# Patient Record
Sex: Female | Born: 1970 | Race: White | Hispanic: No | Marital: Single | Smoking: Former smoker
Health system: Southern US, Community
[De-identification: ages and names within clinical notes are randomized; demographics above are authoritative.]

## PROBLEM LIST (undated history)

## (undated) HISTORY — PX: TUBAL LIGATION: SHX77

## (undated) HISTORY — PX: ABDOMINAL HYSTERECTOMY: SHX81

---

## 2012-07-09 ENCOUNTER — Emergency Department (INDEPENDENT_AMBULATORY_CARE_PROVIDER_SITE_OTHER)
Admission: EM | Admit: 2012-07-09 | Discharge: 2012-07-09 | Disposition: A | Discharge status: Home / Self Care | Payer: No Typology Code available for payment source | Source: Home / Self Care | Attending: Family Medicine | Admitting: Family Medicine

## 2012-07-09 ENCOUNTER — Encounter: Payer: Self-pay | Admitting: *Deleted

## 2012-07-09 DIAGNOSIS — Z23 Encounter for immunization: Secondary | ICD-10-CM

## 2012-07-09 MED ORDER — INFLUENZA VAC TYP A&B SURF ANT IM INJ
0.5000 mL | INJECTION | Freq: Once | INTRAMUSCULAR | Status: AC
  Administered 2012-07-09: 0.5 mL via INTRAMUSCULAR

## 2012-07-09 NOTE — ED Notes (Signed)
The pt is here today for a influenza vaccine.

## 2013-05-28 ENCOUNTER — Emergency Department (INDEPENDENT_AMBULATORY_CARE_PROVIDER_SITE_OTHER)
Admission: EM | Admit: 2013-05-28 | Discharge: 2013-05-28 | Disposition: A | Discharge status: Home / Self Care | Payer: No Typology Code available for payment source | Source: Home / Self Care

## 2013-05-28 ENCOUNTER — Encounter: Payer: Self-pay | Admitting: Emergency Medicine

## 2013-05-28 DIAGNOSIS — Z23 Encounter for immunization: Secondary | ICD-10-CM

## 2013-05-28 DIAGNOSIS — Z0000 Encounter for general adult medical examination without abnormal findings: Secondary | ICD-10-CM

## 2013-05-28 MED ORDER — INFLUENZA VAC SPLIT QUAD 0.5 ML IM SUSP
0.5000 mL | Freq: Once | INTRAMUSCULAR | Status: AC
  Administered 2013-05-28: 0.5 mL via INTRAMUSCULAR

## 2013-05-28 NOTE — ED Notes (Signed)
Flu Shot

## 2014-01-16 ENCOUNTER — Emergency Department (INDEPENDENT_AMBULATORY_CARE_PROVIDER_SITE_OTHER): Payer: No Typology Code available for payment source

## 2014-01-16 ENCOUNTER — Encounter: Payer: Self-pay | Admitting: Emergency Medicine

## 2014-01-16 ENCOUNTER — Emergency Department (INDEPENDENT_AMBULATORY_CARE_PROVIDER_SITE_OTHER)
Admission: EM | Admit: 2014-01-16 | Discharge: 2014-01-16 | Disposition: A | Discharge status: Home / Self Care | Payer: No Typology Code available for payment source | Source: Home / Self Care | Attending: Family Medicine | Admitting: Family Medicine

## 2014-01-16 DIAGNOSIS — S92309A Fracture of unspecified metatarsal bone(s), unspecified foot, initial encounter for closed fracture: Secondary | ICD-10-CM

## 2014-01-16 DIAGNOSIS — S92352A Displaced fracture of fifth metatarsal bone, left foot, initial encounter for closed fracture: Secondary | ICD-10-CM

## 2014-01-16 DIAGNOSIS — M7730 Calcaneal spur, unspecified foot: Secondary | ICD-10-CM

## 2014-01-16 DIAGNOSIS — M25473 Effusion, unspecified ankle: Secondary | ICD-10-CM

## 2014-01-16 DIAGNOSIS — M25476 Effusion, unspecified foot: Secondary | ICD-10-CM

## 2014-01-16 DIAGNOSIS — IMO0002 Reserved for concepts with insufficient information to code with codable children: Secondary | ICD-10-CM

## 2014-01-16 MED ORDER — KETOROLAC TROMETHAMINE 60 MG/2ML IM SOLN
60.0000 mg | Freq: Once | INTRAMUSCULAR | Status: AC
  Administered 2014-01-16: 60 mg via INTRAMUSCULAR

## 2014-01-16 MED ORDER — KETOROLAC TROMETHAMINE 60 MG/2ML IM SOLN: 60.0000 mg | Freq: Once | INTRAMUSCULAR | Status: DC

## 2014-01-16 NOTE — ED Notes (Signed)
Pt states she missed a step and fell down landing on her R knee (bruise only) and L foot and ankle bruised swollen and painful.

## 2014-01-16 NOTE — Discharge Instructions (Signed)
Wear cast boot.  Use crutches.  Elevate leg whenever possible.  May take Ibuprofen 200mg , 4 tabs every 8 hours with food.

## 2014-01-16 NOTE — ED Provider Notes (Signed)
CSN: [id number]     Arrival date & time 01/16/14  1109 History   First MD Initiated Contact with Patient 01/16/14 1202     Chief Complaint  Patient presents with  . Foot Injury  . Ankle Pain      HPI Comments: Patient was walking down stairs 10:30pm yesterday and missed a step.  She twisted her left foot/ankle, resulting in persistent pain/swelling in her lateral foot.  Patient is a 43 y.o. female presenting with foot injury. The history is provided by the patient and the spouse.  Foot Injury Location:  Foot Time since incident:  13 hours Injury: yes   Mechanism of injury comment:  Twisted foot/ankle on stairs Foot location:  L foot Pain details:    Quality:  Aching   Radiates to:  Does not radiate   Severity:  Moderate   Onset quality:  Sudden   Duration:  13 hours   Timing:  Constant   Progression:  Unchanged Chronicity:  New Dislocation: no   Prior injury to area:  No Relieved by:  Nothing Worsened by:  Bearing weight Ineffective treatments:  Ice Associated symptoms: decreased ROM, stiffness and swelling   Associated symptoms: no back pain, no muscle weakness, no numbness and no tingling     History reviewed. No pertinent past medical history. Past Surgical History  Procedure Laterality Date  . Tubal ligation    . Abdominal hysterectomy     History reviewed. No pertinent family history. History  Substance Use Topics  . Smoking status: Former Games developermoker  . Smokeless tobacco: Never Used  . Alcohol Use: No   OB History   Grav Para Term Preterm Abortions TAB SAB Ect Mult Living                 Review of Systems  Musculoskeletal: Positive for stiffness. Negative for back pain.    Allergies  Codeine  Home Medications   Prior to Admission medications   Medication Sig Start Date End Date Taking? Authorizing Provider  FLUoxetine (PROZAC) 10 MG capsule Take 10 mg by mouth daily.   Yes Historical Provider, MD  traZODone (DESYREL) 100 MG tablet Take 100 mg by mouth  at bedtime.    Historical Provider, MD   BP 114/73  Pulse 79  Temp(Src) 98.5 F (36.9 C)  Ht 5\' 8"  (1.727 m)  Wt 167 lb 8 oz (75.978 kg)  BMI 25.47 kg/m2  SpO2 98% Physical Exam  Nursing note and vitals reviewed. Constitutional: She is oriented to person, place, and time. She appears well-developed and well-nourished. No distress.  HENT:  Head: Atraumatic.  Eyes: Conjunctivae are normal. Pupils are equal, round, and reactive to light.  Musculoskeletal:       Left foot: She exhibits decreased range of motion, tenderness, bony tenderness and swelling. She exhibits normal capillary refill, no crepitus, no deformity and no laceration.       Feet:  Left foot has tenderness, swelling, and ecchymosis laterally over the fifth metatarsal as noted on diagram. Decreased range of motion fifth toe.  Distal neurovascular function is intact.    Neurological: She is alert and oriented to person, place, and time.  Skin: Skin is warm and dry.    ED Course  Procedures  none     Imaging Review Dg Ankle Complete Left  01/16/2014   CLINICAL DATA:  Foot pain, laterally, with swelling into the ankle  EXAM: LEFT ANKLE COMPLETE - 3+ VIEW  COMPARISON:  None.  FINDINGS: Mild lateral  soft tissue swelling. No fracture or dislocation. Mortise is intact. Small joint effusion. Tiny heel spur. Known fracture fifth metatarsal.  IMPRESSION: Mild soft tissue swelling in the ankle. Known fifth metatarsal fracture.   Electronically Signed   By: Esperanza Heiraymond  Rubner M.D.   On: 01/16/2014 12:06   Dg Foot Complete Left  01/16/2014   CLINICAL DATA:  Pain lateral foot  EXAM: LEFT FOOT - COMPLETE 3+ VIEW  COMPARISON:  None.  FINDINGS: Comminuted fracture distal shaft of the fifth metatarsal. Distal fracture fragment is displaced an entire shafts width medially. Soft tissue swelling identified over the fifth metatarsal.  IMPRESSION: Fracture fifth metatarsal   Electronically Signed   By: Esperanza Heiraymond  Rubner M.D.   On: 01/16/2014 12:05      MDM   1. Displaced fracture of fifth metatarsal bone, left, closed, initial encounter    Toradol 60mg  IM.   Discussed with Dr. Rodney Langtonhomas Nieves Chapa who will see her in clinic tomorrow morning. Applied cam walker.  Dispensed crutches.  Wear cast boot.  Use crutches.  Elevate leg whenever possible.  May take Ibuprofen 200mg , 4 tabs every 8 hours with food.     Lattie HawStephen A Beese, MD 01/16/14 1315

## 2014-01-17 ENCOUNTER — Ambulatory Visit (INDEPENDENT_AMBULATORY_CARE_PROVIDER_SITE_OTHER): Payer: No Typology Code available for payment source | Admitting: Sports Medicine

## 2014-01-17 ENCOUNTER — Ambulatory Visit (INDEPENDENT_AMBULATORY_CARE_PROVIDER_SITE_OTHER): Payer: No Typology Code available for payment source

## 2014-01-17 DIAGNOSIS — S92352A Displaced fracture of fifth metatarsal bone, left foot, initial encounter for closed fracture: Secondary | ICD-10-CM

## 2014-01-17 DIAGNOSIS — S92309A Fracture of unspecified metatarsal bone(s), unspecified foot, initial encounter for closed fracture: Secondary | ICD-10-CM

## 2014-01-17 DIAGNOSIS — S92353A Displaced fracture of fifth metatarsal bone, unspecified foot, initial encounter for closed fracture: Secondary | ICD-10-CM | POA: Insufficient documentation

## 2014-01-17 DIAGNOSIS — IMO0002 Reserved for concepts with insufficient information to code with codable children: Secondary | ICD-10-CM

## 2014-01-17 MED ORDER — AMBULATORY NON FORMULARY MEDICATION: Status: AC

## 2014-01-17 MED ORDER — HYDROCODONE-ACETAMINOPHEN 5-325 MG PO TABS: 1.0000 | ORAL_TABLET | Freq: Three times a day (TID) | ORAL | Status: DC | PRN

## 2014-01-17 NOTE — Assessment & Plan Note (Addendum)
Reduction under hematoma block. Return in one week for repeat imaging.  Hydrocodone for pain, rolling knee scooter.

## 2014-01-17 NOTE — Progress Notes (Signed)
   Subjective:    I'm seeing this patient as a consultation for:  Dr. Cathren HarshBeese  CC: Left foot fracture  HPI: This is a pleasant 43 year old female, yesterday she misstep, and hyper plantar flexed her foot. She had immediate pain, swelling, bruising. X-ray showed a displaced, angulated, overlapping fracture of the fifth metatarsal neck. She is in a boot and nonweightbearing with crutches. Pain is severe, persistent.  Past medical history, Surgical history, Family history not pertinant except as noted below, Social history, Allergies, and medications have been entered into the medical record, reviewed, and no changes needed.   Review of Systems: No headache, visual changes, nausea, vomiting, diarrhea, constipation, dizziness, abdominal pain, skin rash, fevers, chills, night sweats, weight loss, swollen lymph nodes, body aches, joint swelling, muscle aches, chest pain, shortness of breath, mood changes, visual or auditory hallucinations.   Objective:   General: Well Developed, well nourished, and in no acute distress.  Neuro/Psych: Alert and oriented x3, extra-ocular muscles intact, able to move all 4 extremities, sensation grossly intact. Skin: Warm and dry, no rashes noted.  Respiratory: Not using accessory muscles, speaking in full sentences, trachea midline.  Cardiovascular: Pulses palpable, no extremity edema. Abdomen: Does not appear distended. Left foot: Swollen, bruised, tender palpation of the fifth metatarsal neck. Neurovascularly intact distally.  Procedure:  Fracture reduction. Risks, benefits, and alternatives explained and consent obtained. Time out conducted. Surface cleaned with alcohol. 5cc lidocaine infiltrated around fracture in a hematoma block. Adequate anesthesia ensured. Fracture reduction procedure: Applied axial traction to the neck of the fifth metatarsal, until a palpable pop was felt indicated reduction of the fracture. A bulky Kloosterman splint was then placed. Pt  stable. Postreduction films reviewed and noted improved/near anatomic alignment. Aftercare and follow-up advised.  Impression and Recommendations:   This case required medical decision making of moderate complexity.

## 2014-01-18 ENCOUNTER — Ambulatory Visit (INDEPENDENT_AMBULATORY_CARE_PROVIDER_SITE_OTHER): Payer: No Typology Code available for payment source | Admitting: Sports Medicine

## 2014-01-18 ENCOUNTER — Encounter: Payer: Self-pay | Admitting: Sports Medicine

## 2014-01-18 ENCOUNTER — Telehealth: Payer: Self-pay | Admitting: *Deleted

## 2014-01-18 VITALS — BP 145/86 | HR 90 | Ht 68.0 in | Wt 168.0 lb

## 2014-01-18 DIAGNOSIS — S92309A Fracture of unspecified metatarsal bone(s), unspecified foot, initial encounter for closed fracture: Secondary | ICD-10-CM

## 2014-01-18 DIAGNOSIS — S92352A Displaced fracture of fifth metatarsal bone, left foot, initial encounter for closed fracture: Secondary | ICD-10-CM

## 2014-01-18 MED ORDER — KETOROLAC TROMETHAMINE 60 MG/2ML IM SOLN
60.0000 mg | Freq: Once | INTRAMUSCULAR | Status: AC
  Administered 2014-01-18: 60 mg via INTRAMUSCULAR

## 2014-01-18 MED ORDER — HYDROMORPHONE HCL 8 MG PO TABS: ORAL_TABLET | ORAL | Status: AC

## 2014-01-18 MED ORDER — OXYCODONE-ACETAMINOPHEN 5-325 MG PO TABS: 1.0000 | ORAL_TABLET | Freq: Three times a day (TID) | ORAL | Status: DC | PRN

## 2014-01-18 MED ORDER — HYDROMORPHONE HCL 2 MG PO TABS: ORAL_TABLET | ORAL | Status: DC

## 2014-01-18 NOTE — Telephone Encounter (Signed)
Pt called and is in a lot of pain her Norco is not enough to control the pain.  Didn't sleep last night a lot of throbbing in her foot.  KG CMA

## 2014-01-18 NOTE — Assessment & Plan Note (Signed)
Increasing pain one day post reduction. Repeat x-rays. Remade a new posterior slab splint. Continue nonweightbearing.

## 2014-01-18 NOTE — Telephone Encounter (Signed)
Switching to oxycodone

## 2014-01-18 NOTE — Progress Notes (Signed)
  Subjective: Anatomic reduction of displaced fifth metatarsal fracture yesterday, having increased pain.   Objective: General: Well-developed, well-nourished, and in no acute distress. Left foot: Bulky Gores splint is removed, she does have bruising, mild swelling, neurovascularly intact distally, no skin breakdown.  Posterior slab splint placed  Assessment/plan:

## 2014-01-18 NOTE — Addendum Note (Signed)
Addended by: Justus MemoryGRUBICH, KIMBERLEE S on: 01/18/2014 04:52 PM   Modules accepted: Orders

## 2014-01-19 ENCOUNTER — Ambulatory Visit (INDEPENDENT_AMBULATORY_CARE_PROVIDER_SITE_OTHER): Payer: No Typology Code available for payment source

## 2014-01-19 DIAGNOSIS — S92352A Displaced fracture of fifth metatarsal bone, left foot, initial encounter for closed fracture: Secondary | ICD-10-CM

## 2014-01-19 DIAGNOSIS — IMO0001 Reserved for inherently not codable concepts without codable children: Secondary | ICD-10-CM

## 2014-01-20 NOTE — Assessment & Plan Note (Signed)
Unfortunately the fracture has lost its reduction. At [facility] point [facility]

## 2014-01-21 ENCOUNTER — Ambulatory Visit: Payer: No Typology Code available for payment source | Admitting: Sports Medicine

## 2015-02-19 IMAGING — CR DG FOOT COMPLETE 3+V*L*
3 series · 3 of 3 positions shown · non-contrast
Comparison: Left foot series [DATE].

CLINICAL DATA: Fracture.

EXAM:
LEFT FOOT - COMPLETE 3+ VIEW

[view not recorded (1 of 3)]
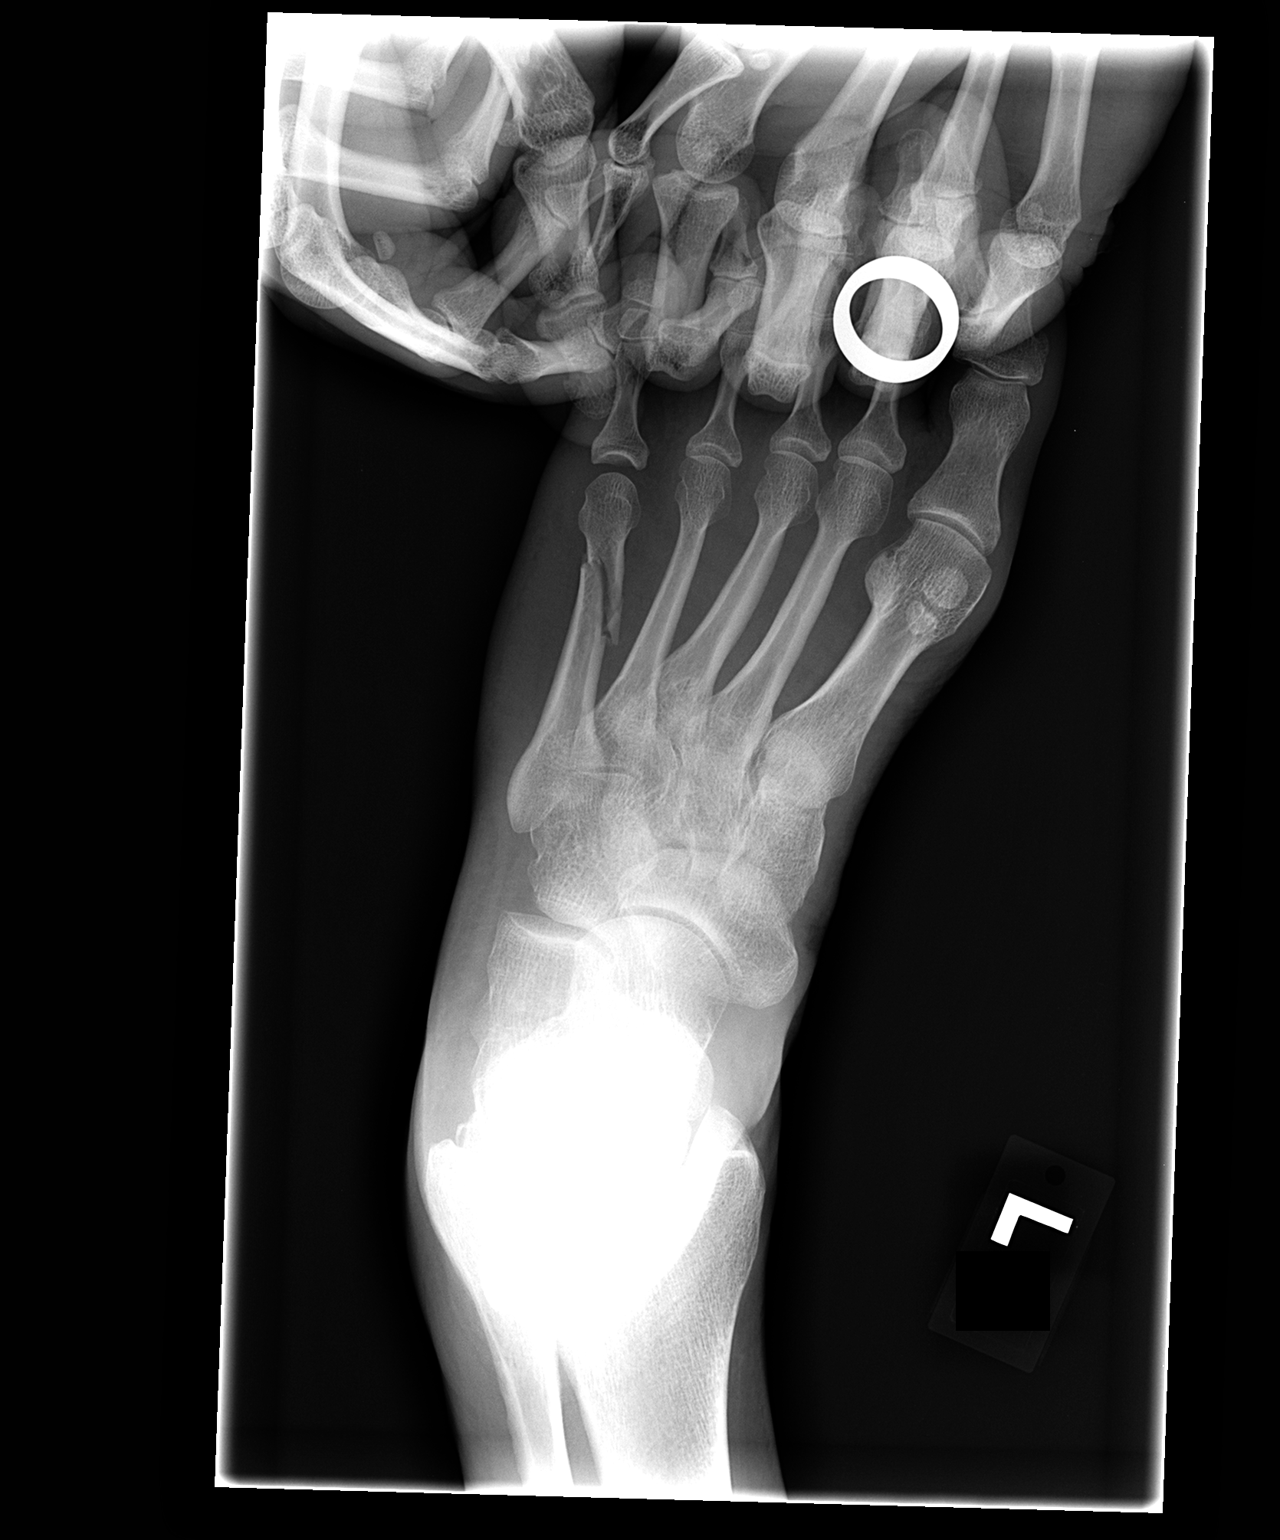

[view not recorded (2 of 3)]
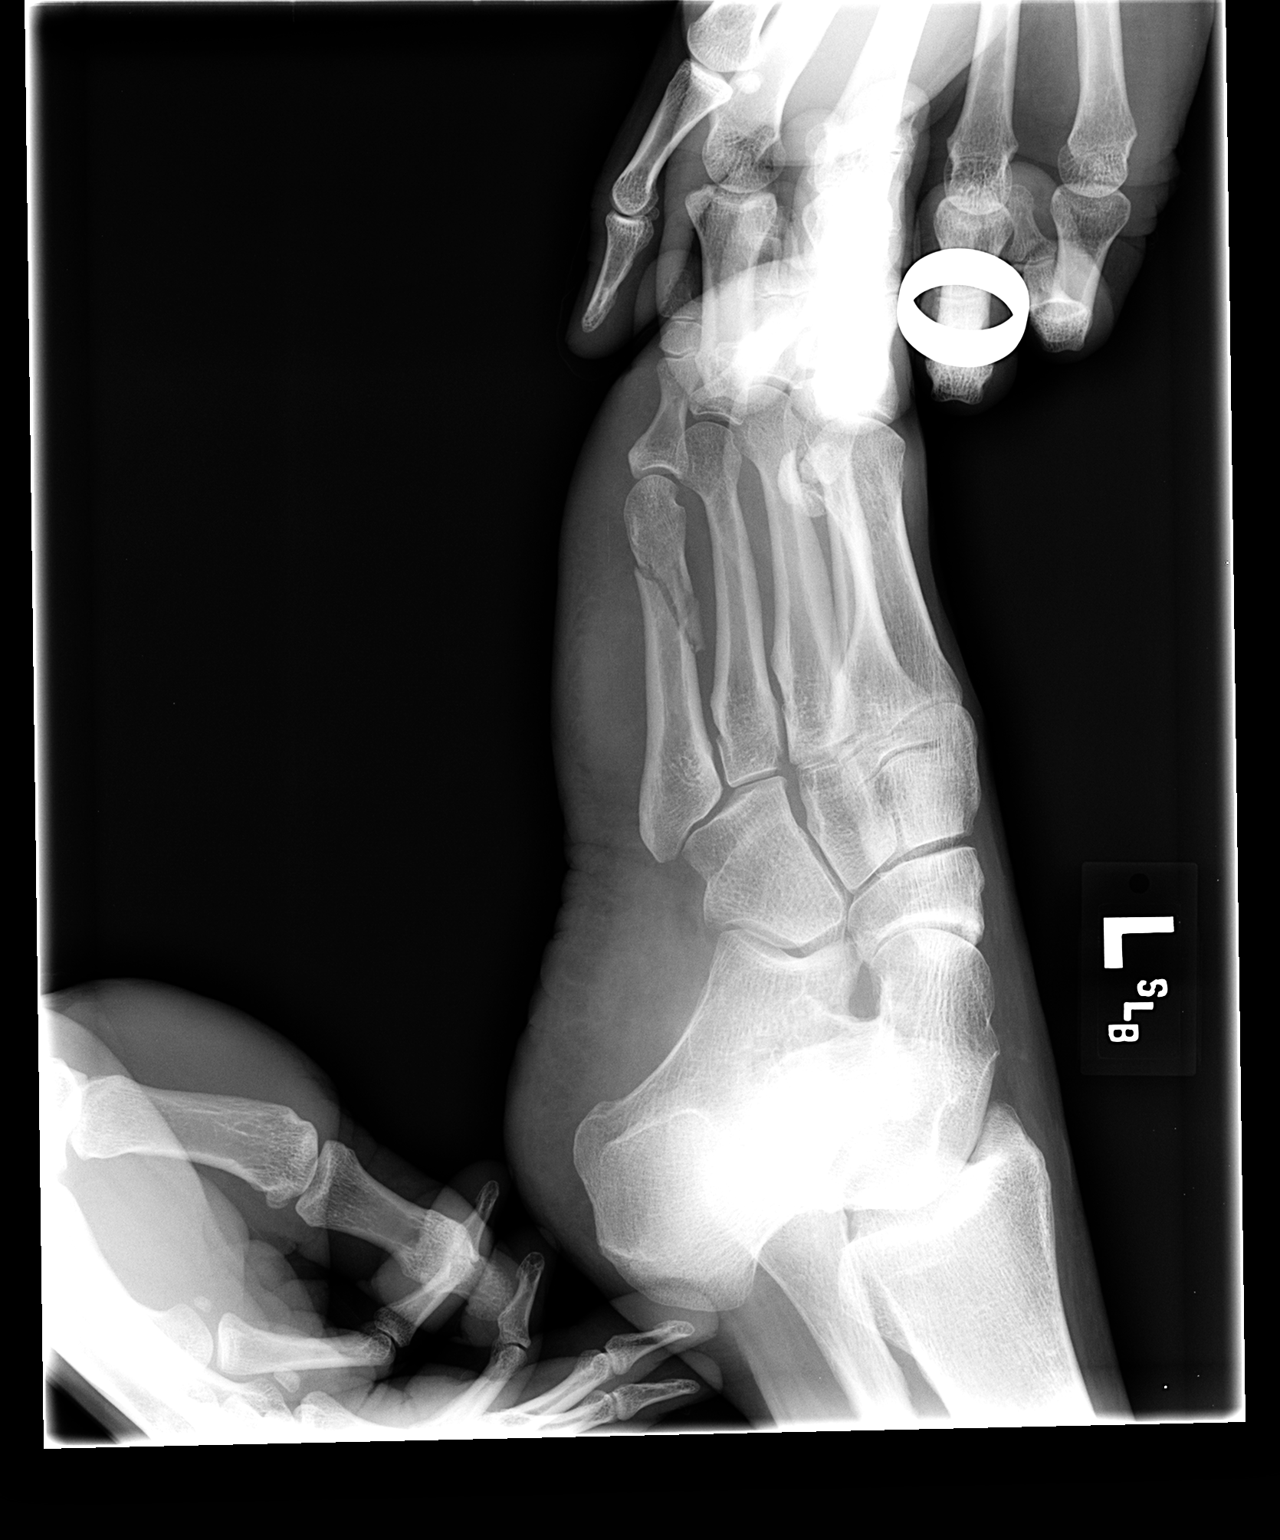

[view not recorded (3 of 3)]
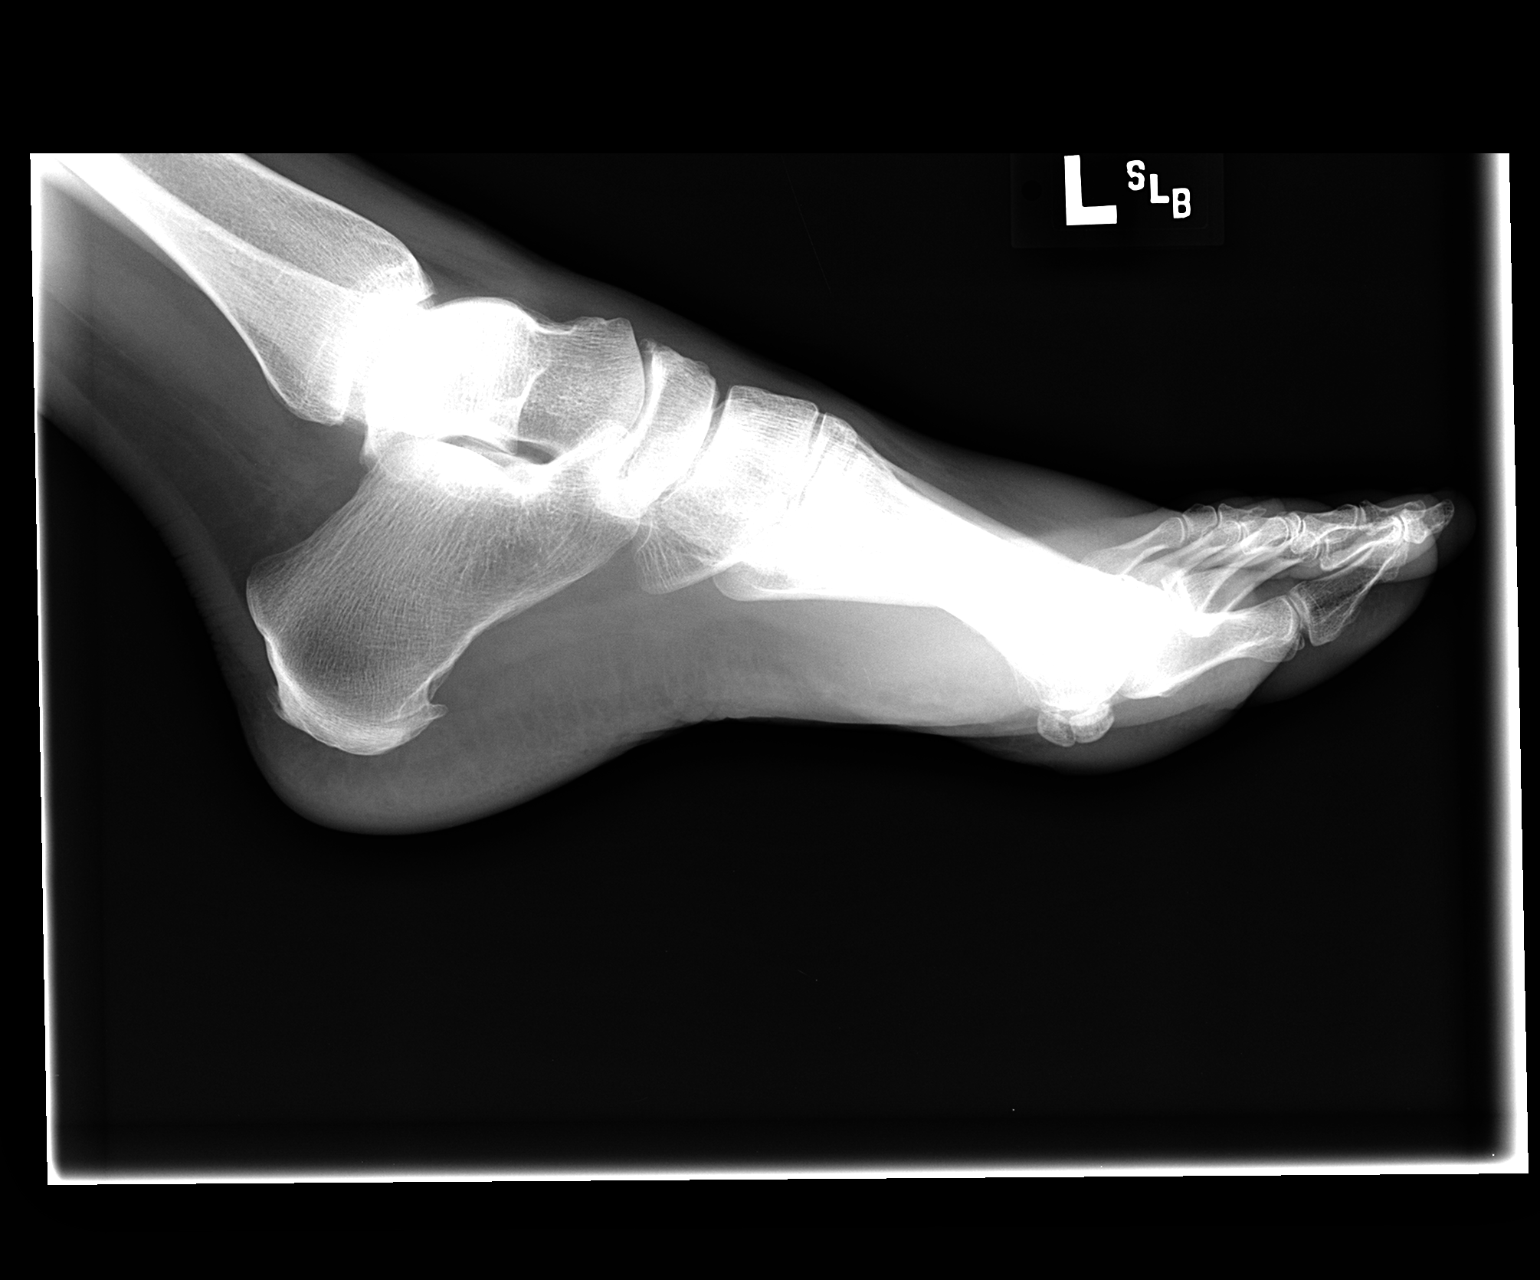

[3 of 3 positions shown; findings below may reference images not displayed]

FINDINGS: Angulated fracture of the distal fifth metatarsal again noted. The
fracture has been partially reduced. Remaining mild displacement and
angulation deformity present. No other focal abnormality identified.
IMPRESSION: Partial reduction of left fifth metatarsal fracture.
